# Patient Record
Sex: Male | Born: 2000 | Race: White | Hispanic: No | Marital: Single
Health system: Southern US, Community
[De-identification: ages and names within clinical notes are randomized; demographics above are authoritative.]

## PROBLEM LIST (undated history)

## (undated) DIAGNOSIS — Z8781 Personal history of (healed) traumatic fracture: Secondary | ICD-10-CM

## (undated) DIAGNOSIS — T7840XA Allergy, unspecified, initial encounter: Secondary | ICD-10-CM

## (undated) HISTORY — DX: Personal history of (healed) traumatic fracture: Z87.81

## (undated) HISTORY — PX: CLAVICLE SURGERY: SHX598

## (undated) HISTORY — DX: Allergy, unspecified, initial encounter: T78.40XA

---
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
## 2021-05-24 ENCOUNTER — Ambulatory Visit: Payer: BC Managed Care – PPO | Admitting: Family Medicine

## 2022-03-11 ENCOUNTER — Ambulatory Visit: Payer: Self-pay | Admitting: Nurse Practitioner

## 2022-03-12 ENCOUNTER — Ambulatory Visit: Payer: Self-pay | Admitting: Family Medicine

## 2022-03-17 ENCOUNTER — Encounter: Payer: Self-pay | Admitting: Family Medicine
